# Patient Record
Sex: Female | Born: 1990 | Race: White | Hispanic: No | Marital: Single | Smoking: Former smoker
Health system: Southern US, Community
[De-identification: ages and names within clinical notes are randomized; demographics above are authoritative.]

## PROBLEM LIST (undated history)

## (undated) DIAGNOSIS — R51 Headache: Secondary | ICD-10-CM

## (undated) DIAGNOSIS — F32A Depression, unspecified: Secondary | ICD-10-CM

## (undated) DIAGNOSIS — T7840XA Allergy, unspecified, initial encounter: Secondary | ICD-10-CM

## (undated) DIAGNOSIS — F329 Major depressive disorder, single episode, unspecified: Secondary | ICD-10-CM

## (undated) DIAGNOSIS — R519 Headache, unspecified: Secondary | ICD-10-CM

## (undated) DIAGNOSIS — R011 Cardiac murmur, unspecified: Secondary | ICD-10-CM

## (undated) DIAGNOSIS — F988 Other specified behavioral and emotional disorders with onset usually occurring in childhood and adolescence: Secondary | ICD-10-CM

## (undated) HISTORY — DX: Major depressive disorder, single episode, unspecified: F32.9

## (undated) HISTORY — DX: Headache, unspecified: R51.9

## (undated) HISTORY — DX: Other specified behavioral and emotional disorders with onset usually occurring in childhood and adolescence: F98.8

## (undated) HISTORY — DX: Allergy, unspecified, initial encounter: T78.40XA

## (undated) HISTORY — DX: Depression, unspecified: F32.A

## (undated) HISTORY — DX: Cardiac murmur, unspecified: R01.1

## (undated) HISTORY — DX: Headache: R51

---
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
## 2012-09-12 LAB — HM PAP SMEAR: HM Pap smear: NORMAL

## 2013-05-17 ENCOUNTER — Encounter: Payer: Self-pay | Admitting: Family Medicine

## 2013-05-17 ENCOUNTER — Ambulatory Visit (INDEPENDENT_AMBULATORY_CARE_PROVIDER_SITE_OTHER): Payer: 59 | Admitting: Family Medicine

## 2013-05-17 VITALS — BP 104/68 | HR 75 | Temp 98.0°F | Ht 66.5 in | Wt 130.5 lb

## 2013-05-17 DIAGNOSIS — Z0000 Encounter for general adult medical examination without abnormal findings: Secondary | ICD-10-CM | POA: Insufficient documentation

## 2013-05-17 DIAGNOSIS — F988 Other specified behavioral and emotional disorders with onset usually occurring in childhood and adolescence: Secondary | ICD-10-CM | POA: Insufficient documentation

## 2013-05-17 DIAGNOSIS — Z136 Encounter for screening for cardiovascular disorders: Secondary | ICD-10-CM

## 2013-05-17 LAB — LIPID PANEL
CHOL/HDL RATIO: 2
CHOLESTEROL: 148 mg/dL (ref 0–200)
HDL: 78.2 mg/dL (ref >39.00)
LDL Cholesterol: 61 mg/dL (ref 0–99)
TRIGLYCERIDES: 43 mg/dL (ref 0.0–149.0)
VLDL: 8.6 mg/dL (ref 0.0–40.0)

## 2013-05-17 LAB — CBC WITH DIFFERENTIAL/PLATELET
Basophils Absolute: 0 10*3/uL (ref 0.0–0.1)
Basophils Relative: 0.8 % (ref 0.0–3.0)
EOS PCT: 1.6 % (ref 0.0–5.0)
Eosinophils Absolute: 0 10*3/uL (ref 0.0–0.7)
HEMATOCRIT: 36 % (ref 36.0–46.0)
HEMOGLOBIN: 12.3 g/dL (ref 12.0–15.0)
Lymphocytes Relative: 42.4 % (ref 12.0–46.0)
Lymphs Abs: 1.3 10*3/uL (ref 0.7–4.0)
MCHC: 34.3 g/dL (ref 30.0–36.0)
MCV: 92.8 fl (ref 78.0–100.0)
MONO ABS: 0.4 10*3/uL (ref 0.1–1.0)
Monocytes Relative: 15 % — ABNORMAL HIGH (ref 3.0–12.0)
NEUTROS ABS: 1.2 10*3/uL — AB (ref 1.4–7.7)
NEUTROS PCT: 40.2 % — AB (ref 43.0–77.0)
Platelets: 196 10*3/uL (ref 150.0–400.0)
RBC: 3.88 Mil/uL (ref 3.87–5.11)
RDW: 12.5 % (ref 11.5–15.5)
WBC: 3 10*3/uL — AB (ref 4.0–10.5)

## 2013-05-17 LAB — COMPREHENSIVE METABOLIC PANEL
ALBUMIN: 4.1 g/dL (ref 3.5–5.2)
ALT: 14 U/L (ref 0–35)
AST: 16 U/L (ref 0–37)
Alkaline Phosphatase: 31 U/L — ABNORMAL LOW (ref 39–117)
BILIRUBIN TOTAL: 1 mg/dL (ref 0.2–1.2)
BUN: 12 mg/dL (ref 6–23)
CALCIUM: 9.2 mg/dL (ref 8.4–10.5)
CHLORIDE: 106 meq/L (ref 96–112)
CO2: 27 meq/L (ref 19–32)
Creatinine, Ser: 0.9 mg/dL (ref 0.4–1.2)
GFR: 78.23 mL/min (ref >60.00)
GLUCOSE: 88 mg/dL (ref 70–99)
POTASSIUM: 4.9 meq/L (ref 3.5–5.1)
SODIUM: 138 meq/L (ref 135–145)
Total Protein: 6.5 g/dL (ref 6.0–8.3)

## 2013-05-17 LAB — TSH: TSH: 0.62 u[IU]/mL (ref 0.35–4.50)

## 2013-05-17 LAB — VITAMIN B12: VITAMIN B 12: 337 pg/mL (ref 211–911)

## 2013-05-17 NOTE — Assessment & Plan Note (Signed)
Awaiting records from psychiatry, then will refill her Adderall.  She is aware of UDS/contolled substances contract.

## 2013-05-17 NOTE — Progress Notes (Signed)
Pre visit review using our clinic review tool, if applicable. No additional management support is needed unless otherwise documented below in the visit note. 

## 2013-05-17 NOTE — Progress Notes (Signed)
Subjective:   Patient ID: Angie BonAmanda Scott, female    DOB: 07/04/1990, 23 y.o.   MRN: [id number]  Angie Scott is a pleasant 23 y.o. year old female who presents to clinic today with Establish Care  on 05/17/2013  HPI: ADD- formal testing in 2010- [place] in [place].  Recently moved back home- [place], boyfriend [place].  She done Social research officer, governmentinterior design.  G0- sexually active with boyfriend only.  Recently screened for STDs.  Uses condoms.  She thinks she is due for pap smear.  Denies any dysuria or vaginal discharge.  She does run and tries to eat a healthy diet.  Patient Active Problem List   Diagnosis Date Noted  . ADD (attention deficit disorder) 05/17/2013  . Routine general medical examination at a health care facility 05/17/2013   Past Medical History  Diagnosis Date  . Allergy   . Heart murmur   . Frequent headaches   . ADD (attention deficit disorder)   . Depression    Past Surgical History  Procedure Laterality Date  . Tonsillectomy and adenoidectomy  2009   History  Substance Use Topics  . Smoking status: Former Games developermoker  . Smokeless tobacco: Not on file     Comment: in college  . Alcohol Use: Yes     Comment: occasional   Family History  Problem Relation Age of Onset  . Depression Father   . Diabetes Father   . Depression Sister     Anxiety  . Heart disease Maternal Uncle   . Heart disease Maternal Grandmother   . Alcohol abuse Paternal Grandfather    Allergies  Allergen Reactions  . Lexapro [Escitalopram Oxalate]     Rash   . Nickel Swelling and Rash   No current outpatient prescriptions on file prior to visit.   No current facility-administered medications on file prior to visit.   The PMH, PSH, Social History, Family History, Medications, and allergies have been [facility], and have been updated if relevant.   Review of Systems See HPI Patient reports no  vision/ hearing changes,anorexia,  weight change, fever ,adenopathy, persistant / recurrent hoarseness, swallowing issues, chest pain, edema,persistant / recurrent cough, hemoptysis, dyspnea(rest, exertional, paroxysmal nocturnal), gastrointestinal  bleeding (melena, rectal bleeding), abdominal pain, excessive heart burn, GU symptoms(dysuria, hematuria, pyuria, voiding/incontinence  Issues) syncope, focal weakness, severe memory loss, concerning skin lesions, depression, anxiety, abnormal bruising/bleeding, major joint swelling, breast masses or abnormal vaginal bleeding.       Objective:    BP 104/68  Pulse 75  Temp(Src) 98 F (36.7 C) (Oral)  Ht 5' 6.5" (1.689 m)  Wt 130 lb 8 oz (59.194 kg)  BMI 20.75 kg/m2  SpO2 98%  LMP 04/26/2013   Physical Exam   General:  Well-developed,well-nourished,in no acute distress; alert,appropriate and cooperative throughout examination Head:  normocephalic and atraumatic.   Eyes:  vision grossly intact, pupils equal, pupils round, and pupils reactive to light.   Lungs:  Normal respiratory effort, chest expands symmetrically. Lungs are clear to auscultation, no crackles or wheezes. Heart:  Normal rate and regular rhythm. S1 and S2 normal without gallop, murmur, click, rub or other extra sounds. Abdomen:  Bowel sounds positive,abdomen soft and non-tender without masses, organomegaly or hernias noted. Msk:  No deformity or scoliosis noted of thoracic or lumbar spine.   Extremities:  No clubbing, cyanosis, edema, or deformity noted with normal full range of motion of all joints.   Neurologic:  alert &  oriented X3 and gait normal.   Skin:  Intact without suspicious lesions or rashes Cervical Nodes:  No lymphadenopathy noted Axillary Nodes:  No palpable lymphadenopathy Psych:  Cognition and judgment appear intact. Alert and cooperative with normal attention span and concentration. No apparent delusions, illusions, hallucinations     Assessment & Plan:   ADD (attention deficit  disorder)  Routine general medical examination at a health care facility - Plan: Vitamin B12, CBC with Differential, Comprehensive metabolic panel, TSH  Screening for ischemic heart disease - Plan: Lipid panel Return for a complete physical..

## 2013-05-17 NOTE — Patient Instructions (Signed)
Nice to meet you. We will call you with your lab results.   

## 2013-05-17 NOTE — Assessment & Plan Note (Signed)
Reviewed preventive care protocols, scheduled due services, and updated immunizations Discussed nutrition, exercise, diet, and healthy lifestyle.  She would like to return for pap smear at later date. Orders Placed This Encounter  Procedures  . HM PAP SMEAR  . Vitamin B12  . CBC with Differential  . Comprehensive metabolic panel  . Lipid panel  . TSH

## 2013-07-31 ENCOUNTER — Encounter: Payer: 59 | Admitting: Family Medicine

## 2013-08-01 NOTE — Progress Notes (Signed)
   Subjective:    Patient ID: Angie Scott, female    DOB: 05/29/1990, 23 y.o.   MRN: [id number]  HPI    Review of Systems     Objective:   Physical Exam        Assessment & Plan:  error

## 2013-08-07 ENCOUNTER — Encounter: Payer: 59 | Admitting: Family Medicine

## 2013-08-07 ENCOUNTER — Ambulatory Visit (INDEPENDENT_AMBULATORY_CARE_PROVIDER_SITE_OTHER): Payer: 59 | Admitting: Family Medicine

## 2013-08-07 ENCOUNTER — Other Ambulatory Visit (HOSPITAL_COMMUNITY)
Admission: RE | Admit: 2013-08-07 | Discharge: 2013-08-07 | Disposition: A | Discharge status: Home / Self Care | Payer: 59 | Source: Ambulatory Visit | Attending: Family Medicine | Admitting: Family Medicine

## 2013-08-07 ENCOUNTER — Encounter: Payer: Self-pay | Admitting: Family Medicine

## 2013-08-07 VITALS — BP 108/68 | HR 66 | Temp 98.3°F | Wt 127.5 lb

## 2013-08-07 DIAGNOSIS — Z1151 Encounter for screening for human papillomavirus (HPV): Secondary | ICD-10-CM | POA: Insufficient documentation

## 2013-08-07 DIAGNOSIS — Z01419 Encounter for gynecological examination (general) (routine) without abnormal findings: Secondary | ICD-10-CM | POA: Insufficient documentation

## 2013-08-07 DIAGNOSIS — Z0000 Encounter for general adult medical examination without abnormal findings: Secondary | ICD-10-CM

## 2013-08-07 MED ORDER — AMPHETAMINE-DEXTROAMPHETAMINE 20 MG PO TABS: 40.0000 mg | ORAL_TABLET | Freq: Every day | ORAL | Status: DC

## 2013-08-07 NOTE — Progress Notes (Signed)
Subjective:   Patient ID: Angie Scott, female    DOB: 06/16/1990, 23 y.o.   MRN: [id number]  Angie Bonmanda Melaragno is a pleasant 23 y.o. year old female who presents to clinic today with Gynecologic Exam  on 08/07/2013  HPI: G0- sexually active with boyfriend only. Recently screened for STDs. Uses condoms. She thinks she is due for pap smear.  Denies any dysuria or vaginal discharge.  Current Outpatient Prescriptions on File Prior to Visit  Medication Sig Dispense Refill  . Multiple Vitamin (MULTIVITAMIN) tablet Take 1 tablet by mouth daily.       No current facility-administered medications on file prior to visit.    Allergies  Allergen Reactions  . Lexapro [Escitalopram Oxalate]     Rash   . Nickel Swelling and Rash    Past Medical History  Diagnosis Date  . Allergy   . Heart murmur   . Frequent headaches   . ADD (attention deficit disorder)   . Depression     Past Surgical History  Procedure Laterality Date  . Tonsillectomy and adenoidectomy  2009    Family History  Problem Relation Age of Onset  . Depression Father   . Diabetes Father   . Depression Sister     Anxiety  . Heart disease Maternal Uncle   . Heart disease Maternal Grandmother   . Alcohol abuse Paternal Grandfather     History   Social History  . Marital Status: Single    Spouse Name: N/A    Number of Children: N/A  . Years of Education: N/A   Occupational History  . Not on file.   Social History Main Topics  . Smoking status: Former Games developermoker  . Smokeless tobacco: Not on file     Comment: in college  . Alcohol Use: Yes     Comment: occasional  . Drug Use: No  . Sexual Activity: Not on file   Other Topics Concern  . Not on file   Social History Narrative  . No narrative on file   The PMH, PSH, Social History, Family History, Medications, and allergies have been [facility], and have been updated if relevant.   Review of Systems   See HPI No dysuria No vaginal  discharge  Objective:    BP 108/68  Pulse 66  Temp(Src) 98.3 F (36.8 C) (Oral)  Wt 127 lb 8 oz (57.834 kg)  SpO2 98%  LMP 07/30/2013   Physical Exam   General:  Well-developed,well-nourished,in no acute distress; alert,appropriate and cooperative throughout examination Head:  normocephalic and atraumatic.   Breasts:  No mass, nodules, thickening, tenderness, bulging, retraction, inflamation, nipple discharge or skin changes noted.  . Rectal:  no external abnormalities.   Genitalia:  Pelvic Exam:        External: normal female genitalia without lesions or masses        Vagina: normal without lesions or masses        Cervix: normal without lesions or masses        Adnexa: normal bimanual exam without masses or fullness        Uterus: normal by palpation        Pap smear: performed Skin:  Intact without suspicious lesions or rashes Psych:  Cognition and judgment appear intact. Alert and cooperative with normal attention span and concentration. No apparent delusions, illusions, hallucinations     Assessment & Plan:   Encounter for routine gynecological examination - Plan: Cytology - PAP [place]  Routine  general medical examination at a health care facility - Plan: Cytology - PAP [place] No Follow-up on file.

## 2013-08-07 NOTE — Progress Notes (Signed)
Pre visit review using our clinic review tool, if applicable. No additional management support is needed unless otherwise documented below in the visit note. 

## 2013-08-07 NOTE — Assessment & Plan Note (Signed)
Pap smear done today.  Discussed dangers of smoking, alcohol, and drug abuse.  Also discussed sexual activity, pregnancy risk, and STD risk.  Encouraged to get regular exercise.  

## 2013-08-07 NOTE — Assessment & Plan Note (Deleted)
Pap smear done today

## 2013-08-09 ENCOUNTER — Encounter: Payer: Self-pay | Admitting: *Deleted

## 2013-08-09 LAB — CYTOLOGY - PAP

## 2013-08-16 ENCOUNTER — Telehealth: Payer: Self-pay

## 2013-08-16 MED ORDER — AMPHETAMINE-DEXTROAMPHETAMINE 20 MG PO TABS: 40.0000 mg | ORAL_TABLET | Freq: Every day | ORAL | Status: DC

## 2013-08-16 NOTE — Telephone Encounter (Signed)
Pt left v/m; pt took adderall 20 mg rx to pharmacy and was advised to get another adderall rx for quantity # 60(pt takes 2 tabs daily). Pt request cb when ready for pick up. Pt said pharmacy advised pt will also need prior auth for adderall. Pt request cb.

## 2013-08-17 ENCOUNTER — Telehealth: Payer: Self-pay | Admitting: Family Medicine

## 2013-08-17 ENCOUNTER — Encounter: Payer: Self-pay | Admitting: Family Medicine

## 2013-08-17 NOTE — Telephone Encounter (Signed)
Lm on pts vm advising her that she not on adderall XR based on medication list. Pt advised to contact office back should she have any additional questions

## 2013-08-17 NOTE — Telephone Encounter (Signed)
Spoke to pt who states that she will contact [place] and have them fax over confirmation that pt was taking Adderall XR instead of regular Adderall

## 2013-08-17 NOTE — Telephone Encounter (Signed)
Lm on pts vm informing her Rx is available for pickup at the front desk 

## 2013-08-17 NOTE — Telephone Encounter (Signed)
Patient returned your call.

## 2013-08-17 NOTE — Telephone Encounter (Signed)
Pt left v/m; pt picked up corrected rx for adderall. Pt said she needs to come back for another corrected prescription of adderall XR.(Do not see XR on current or historical med list.) Pt request cb.

## 2013-08-21 NOTE — Telephone Encounter (Signed)
Ok to change

## 2013-08-21 NOTE — Telephone Encounter (Signed)
Paperwork received indicating pt takes Adderall XR 20mg  1-2 tabs every am. Pt is requesting a new Rx to reflect new sig and to have #60

## 2013-08-21 NOTE — Telephone Encounter (Addendum)
Pt left v/m wanting to verify received documentation from PCP [place] and pt request cb with status of being able to pickup adderall xr rx. Pt is leaving country on 08/25/13.

## 2013-09-04 ENCOUNTER — Encounter: Payer: Self-pay | Admitting: Family Medicine

## 2014-03-22 ENCOUNTER — Ambulatory Visit (INDEPENDENT_AMBULATORY_CARE_PROVIDER_SITE_OTHER): Payer: 59

## 2014-03-22 ENCOUNTER — Ambulatory Visit (INDEPENDENT_AMBULATORY_CARE_PROVIDER_SITE_OTHER): Payer: 59 | Admitting: Podiatry

## 2014-03-22 ENCOUNTER — Encounter: Payer: Self-pay | Admitting: Podiatry

## 2014-03-22 VITALS — BP 101/60 | HR 72 | Resp 12

## 2014-03-22 DIAGNOSIS — M21611 Bunion of right foot: Secondary | ICD-10-CM

## 2014-03-22 DIAGNOSIS — M21612 Bunion of left foot: Secondary | ICD-10-CM

## 2014-03-22 DIAGNOSIS — M2012 Hallux valgus (acquired), left foot: Secondary | ICD-10-CM

## 2014-03-22 DIAGNOSIS — M2011 Hallux valgus (acquired), right foot: Secondary | ICD-10-CM

## 2014-03-22 DIAGNOSIS — M779 Enthesopathy, unspecified: Secondary | ICD-10-CM | POA: Diagnosis not present

## 2014-03-22 NOTE — Patient Instructions (Signed)
Pre-Operative Instructions  Congratulations, you have decided to take an important step to improving your quality of life.  You can be assured that the doctors of [facility] will be with you every step of the way.  1. Plan to be at the surgery center/hospital at least 1 (one) hour prior to your scheduled time unless otherwise directed by the surgical center/hospital staff.  You must have a responsible adult accompany you, remain during the surgery and drive you home.  Make sure you have directions to the surgical center/hospital and know how to get there on time. 2. For hospital based surgery you will need to obtain a history and physical form from your family physician within 1 month prior to the date of surgery- we will give you a form for you primary physician.  3. We make every effort to accommodate the date you request for surgery.  There are however, times where surgery dates or times have to be moved.  We will contact you as soon as possible if a change in schedule is required.   4. No Aspirin/Ibuprofen for one week before surgery.  If you are on aspirin, any non-steroidal anti-inflammatory medications (Mobic, Aleve, Ibuprofen) you should stop taking it 7 days prior to your surgery.  You make take Tylenol  For pain prior to surgery.  5. Medications- If you are taking daily heart and blood pressure medications, seizure, reflux, allergy, asthma, anxiety, pain or diabetes medications, make sure the surgery center/hospital is aware before the day of surgery so they may notify you which medications to take or avoid the day of surgery. 6. No food or drink after midnight the night before surgery unless directed otherwise by surgical center/hospital staff. 7. No alcoholic beverages 24 hours prior to surgery.  No smoking 24 hours prior to or 24 hours after surgery. 8. Wear loose pants or shorts- loose enough to fit over bandages, boots, and casts. 9. No slip on shoes, sneakers are best. 10. Bring  your boot with you to the surgery center/hospital.  Also bring crutches or a walker if your physician has prescribed it for you.  If you do not have this equipment, it will be provided for you after surgery. 11. If you have not been contracted by the surgery center/hospital by the day before your surgery, call to confirm the date and time of your surgery. 12. Leave-time from work may vary depending on the type of surgery you have.  Appropriate arrangements should be made prior to surgery with your employer. 13. Prescriptions will be provided immediately following surgery by your doctor.  Have these filled as soon as possible after surgery and take the medication as directed. 14. Remove nail polish on the operative foot. 15. Wash the night before surgery.  The night before surgery wash the foot and leg well with the antibacterial soap provided and water paying special attention to beneath the toenails and in between the toes.  Rinse thoroughly with water and dry well with a towel.  Perform this wash unless told not to do so by your physician.  Enclosed: 1 Ice pack (please put in freezer the night before surgery)   1 Hibiclens skin cleaner   Pre-op Instructions  If you have any questions regarding the instructions, do not hesitate to call our office.  [place]: [address] [place], [place] [address] [phone]  [place]: [address] [place], [place] [address] [phone]  [place]: [address]  [place], [place] [address] [phone]  Dr. Richard   Tuchman DPM, Dr. Norman Regal DPM Dr. Richard Sikora DPM, Dr. M. Todd Hyatt DPM, Dr. Kathryn Egerton DPM 

## 2014-03-22 NOTE — Progress Notes (Signed)
Subjective:     Patient ID: Angie BonAmanda Scott, female   DOB: 04/20/1990, 24 y.o.   MRN: [id number]  HPI patient states I have bunions on both feet with the right one hurting me more than the left one and states she's tried to modify her shoe gear has tried soaks and has significant family history of this problem with mother having the same issues   Review of Systems  All other systems reviewed and are negative.      Objective:   Physical Exam  Constitutional: She is oriented to person, place, and time.  Cardiovascular: Intact distal pulses.   Musculoskeletal: Normal range of motion.  Neurological: She is oriented to person, place, and time.  Skin: Skin is warm.  Nursing note and vitals reviewed.  neurovascular status intact with muscle strength adequate and range of motion subtalar midtarsal joint within normal limits. Patient's noted to have structural redness around the first metatarsal head right over left with deviation of the hallux against the second toe and is noted to have pain with pressure. 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 The right foot is quite symptomatic and pain when pressed and she would like to have it corrected due to long-term history failure to be able to wear adequate shoe gear and family history. I do agree with her that this is best to correct at this point due to the advancement of symptoms and pain and I allowed her to read a consent form concerning correction reviewing all possible complications and alternative treatments. Patient wants surgery signed consent form and is given all preoperative instructions at this time and is dispensed air fracture walker for the postoperative period with instructions on  how to get used to it. Also at this time she is scanned for custom orthotics to reduce stress against her arches

## 2014-03-22 NOTE — Progress Notes (Signed)
   Subjective:    Patient ID: Angie Scott, female    DOB: 11/09/1990, 24 y.o.   MRN: [id number]  HPI  PT STATED B/L BUNION ARE SORE ESPECIALLY THE RT FOOT FOR 1 YEAR. FEET ARE GETTING WORSE ESPECIALLY WHEN WEARING FLAT SHOES/BAREFOOT. TRIED WEARING GOOD SHOES/RUBBER AROUND JOINT OF THE BIG TOE BUT NO HELP.  Review of Systems  All other systems reviewed and are negative.      Objective:   Physical Exam        Assessment & Plan:

## 2014-03-26 ENCOUNTER — Ambulatory Visit: Payer: Self-pay | Admitting: Family Medicine

## 2014-03-30 ENCOUNTER — Telehealth: Payer: Self-pay | Admitting: *Deleted

## 2014-03-30 NOTE — Telephone Encounter (Addendum)
"  I want to schedule surgery with Dr. Charlsie Merlesegal."  When would you like to schedule?  "I would like to schedule for a day first week of April."  He can do it on 04/17/2014.  "That will be great.  I'd also like to speak to someone in billing to see how much thw surgery is going to cost me."  I will take care of that for you but I will have to call you back with that information after I do the calculations.  "Okay, that will be fine."

## 2014-04-17 ENCOUNTER — Encounter: Payer: Self-pay | Admitting: Podiatry

## 2014-04-17 DIAGNOSIS — M2011 Hallux valgus (acquired), right foot: Secondary | ICD-10-CM | POA: Diagnosis not present

## 2014-04-20 NOTE — Progress Notes (Signed)
DOS 04/17/2014 Austin bunionectomy (cutting and moving bone) with pin fixation right foot.

## 2014-04-23 ENCOUNTER — Ambulatory Visit (INDEPENDENT_AMBULATORY_CARE_PROVIDER_SITE_OTHER): Payer: 59

## 2014-04-23 ENCOUNTER — Ambulatory Visit (INDEPENDENT_AMBULATORY_CARE_PROVIDER_SITE_OTHER): Payer: 59 | Admitting: Podiatry

## 2014-04-23 DIAGNOSIS — Z9889 Other specified postprocedural states: Secondary | ICD-10-CM

## 2014-04-23 DIAGNOSIS — M2011 Hallux valgus (acquired), right foot: Secondary | ICD-10-CM

## 2014-04-23 DIAGNOSIS — M21611 Bunion of right foot: Secondary | ICD-10-CM

## 2014-04-23 DIAGNOSIS — M2012 Hallux valgus (acquired), left foot: Secondary | ICD-10-CM

## 2014-04-23 DIAGNOSIS — M21612 Bunion of left foot: Secondary | ICD-10-CM

## 2014-04-24 NOTE — Progress Notes (Signed)
Subjective:     Patient ID: Gae BonAmanda Laplant, female   DOB: 03/01/1990, 24 y.o.   MRN: [id number]  HPI patient presents stating my right foot is doing really well and I want to consider getting my left bunion fixed   Review of Systems     Objective:   Physical Exam Neurovascular status intact muscle strength adequate with well-healed surgical site right first metatarsal with hallux in rectus position wound edges well coapted and good motion of the joint with 30 of dorsiflexion 20 plantar flexion. Structural bunion noted left    Assessment:     Doing well post osteotomy first metatarsal right with structural bunion deformity noted left    Plan:     H&P and x-rays reviewed of right foot. Patient will continue with immobilization compression and made range of motion exercises and will be seen back again in 3 weeks. Discussed correction of the left which she can get done in the next several weeks and she will look at her schedule and decide if she wants to go this direction and if she does we will have to do consent form prior to procedure

## 2014-05-14 ENCOUNTER — Ambulatory Visit (INDEPENDENT_AMBULATORY_CARE_PROVIDER_SITE_OTHER): Payer: 59

## 2014-05-14 ENCOUNTER — Encounter: Payer: Self-pay | Admitting: Podiatry

## 2014-05-14 ENCOUNTER — Ambulatory Visit (INDEPENDENT_AMBULATORY_CARE_PROVIDER_SITE_OTHER): Payer: 59 | Admitting: Podiatry

## 2014-05-14 DIAGNOSIS — Z9889 Other specified postprocedural states: Secondary | ICD-10-CM

## 2014-05-14 DIAGNOSIS — M21611 Bunion of right foot: Secondary | ICD-10-CM

## 2014-05-14 DIAGNOSIS — M2011 Hallux valgus (acquired), right foot: Secondary | ICD-10-CM

## 2014-05-14 NOTE — Progress Notes (Signed)
Subjective:     Patient ID: Angie Scott, female   DOB: 07/03/1990, 24 y.o.   MRN: [id number]  HPI patient presents stating I'm doing very well but I been on hold off on getting my other foot fixed as on antibiotic   Review of Systems     Objective:   Physical Exam Neurovascular status intact muscle strength adequate with range of motion of the subtalar midtarsal joint within normal limits. Patient's noted to have a well structured first metatarsal right with good alignment of the hallux and no indications of movement    Assessment:     Doing well post Austin osteotomy right foot    Plan:     H&P and x-rays reviewed with patient and today I'm allowing her to gradual return soft shoe and I dispensed and anklet. Reappoint to recheck in 4 weeks

## 2014-05-24 ENCOUNTER — Encounter: Payer: Self-pay | Admitting: Family Medicine

## 2014-05-30 ENCOUNTER — Other Ambulatory Visit (HOSPITAL_COMMUNITY)
Admission: RE | Admit: 2014-05-30 | Discharge: 2014-05-30 | Disposition: A | Discharge status: Home / Self Care | Payer: 59 | Source: Ambulatory Visit | Attending: Family Medicine | Admitting: Family Medicine

## 2014-05-30 ENCOUNTER — Encounter: Payer: 59 | Admitting: Podiatry

## 2014-05-30 ENCOUNTER — Encounter: Payer: Self-pay | Admitting: Family Medicine

## 2014-05-30 ENCOUNTER — Ambulatory Visit (INDEPENDENT_AMBULATORY_CARE_PROVIDER_SITE_OTHER): Payer: 59 | Admitting: Family Medicine

## 2014-05-30 VITALS — BP 106/58 | HR 73 | Temp 97.6°F | Ht 66.75 in | Wt 131.5 lb

## 2014-05-30 DIAGNOSIS — Z30011 Encounter for initial prescription of contraceptive pills: Secondary | ICD-10-CM

## 2014-05-30 DIAGNOSIS — Z113 Encounter for screening for infections with a predominantly sexual mode of transmission: Secondary | ICD-10-CM

## 2014-05-30 DIAGNOSIS — F909 Attention-deficit hyperactivity disorder, unspecified type: Secondary | ICD-10-CM | POA: Diagnosis not present

## 2014-05-30 DIAGNOSIS — F988 Other specified behavioral and emotional disorders with onset usually occurring in childhood and adolescence: Secondary | ICD-10-CM

## 2014-05-30 DIAGNOSIS — Z1151 Encounter for screening for human papillomavirus (HPV): Secondary | ICD-10-CM | POA: Insufficient documentation

## 2014-05-30 DIAGNOSIS — Z01419 Encounter for gynecological examination (general) (routine) without abnormal findings: Secondary | ICD-10-CM

## 2014-05-30 DIAGNOSIS — Z0000 Encounter for general adult medical examination without abnormal findings: Secondary | ICD-10-CM

## 2014-05-30 DIAGNOSIS — N760 Acute vaginitis: Secondary | ICD-10-CM | POA: Diagnosis present

## 2014-05-30 DIAGNOSIS — Z309 Encounter for contraceptive management, unspecified: Secondary | ICD-10-CM | POA: Insufficient documentation

## 2014-05-30 LAB — COMPREHENSIVE METABOLIC PANEL
ALBUMIN: 4.3 g/dL (ref 3.5–5.2)
ALK PHOS: 37 U/L — AB (ref 39–117)
ALT: 16 U/L (ref 0–35)
AST: 18 U/L (ref 0–37)
BUN: 11 mg/dL (ref 6–23)
CALCIUM: 9.3 mg/dL (ref 8.4–10.5)
CHLORIDE: 104 meq/L (ref 96–112)
CO2: 30 meq/L (ref 19–32)
Creatinine, Ser: 0.8 mg/dL (ref 0.40–1.20)
GFR: 92.3 mL/min (ref >60.00)
GLUCOSE: 98 mg/dL (ref 70–99)
POTASSIUM: 4.1 meq/L (ref 3.5–5.1)
Sodium: 139 mEq/L (ref 135–145)
Total Bilirubin: 1 mg/dL (ref 0.2–1.2)
Total Protein: 6.8 g/dL (ref 6.0–8.3)

## 2014-05-30 LAB — TSH: TSH: 1.38 u[IU]/mL (ref 0.35–4.50)

## 2014-05-30 LAB — CBC WITH DIFFERENTIAL/PLATELET
BASOS ABS: 0 10*3/uL (ref 0.0–0.1)
Basophils Relative: 1.2 % (ref 0.0–3.0)
EOS ABS: 0.1 10*3/uL (ref 0.0–0.7)
Eosinophils Relative: 4.5 % (ref 0.0–5.0)
HCT: 37.6 % (ref 36.0–46.0)
HEMOGLOBIN: 12.9 g/dL (ref 12.0–15.0)
LYMPHS PCT: 39.4 % (ref 12.0–46.0)
Lymphs Abs: 1.2 10*3/uL (ref 0.7–4.0)
MCHC: 34.2 g/dL (ref 30.0–36.0)
MCV: 92.3 fl (ref 78.0–100.0)
MONOS PCT: 10.7 % (ref 3.0–12.0)
Monocytes Absolute: 0.3 10*3/uL (ref 0.1–1.0)
NEUTROS ABS: 1.3 10*3/uL — AB (ref 1.4–7.7)
NEUTROS PCT: 44.2 % (ref 43.0–77.0)
Platelets: 239 10*3/uL (ref 150.0–400.0)
RBC: 4.07 Mil/uL (ref 3.87–5.11)
RDW: 12.7 % (ref 11.5–15.5)
WBC: 3 10*3/uL — ABNORMAL LOW (ref 4.0–10.5)

## 2014-05-30 LAB — LIPID PANEL
Cholesterol: 165 mg/dL (ref 0–200)
HDL: 73.5 mg/dL (ref >39.00)
LDL Cholesterol: 78 mg/dL (ref 0–99)
NONHDL: 91.5
TRIGLYCERIDES: 67 mg/dL (ref 0.0–149.0)
Total CHOL/HDL Ratio: 2
VLDL: 13.4 mg/dL (ref 0.0–40.0)

## 2014-05-30 MED ORDER — NORETHIN-ETH ESTRAD-FE BIPHAS 1 MG-10 MCG / 10 MCG PO TABS: 1.0000 | ORAL_TABLET | Freq: Every day | ORAL | Status: DC

## 2014-05-30 NOTE — Progress Notes (Signed)
Pre visit review using our clinic review tool, if applicable. No additional management support is needed unless otherwise documented below in the visit note. 

## 2014-05-30 NOTE — Assessment & Plan Note (Signed)
Reviewed preventive care protocols, scheduled due services, and updated immunizations Discussed nutrition, exercise, diet, and healthy lifestyle.  Orders Placed This Encounter  Procedures  . CBC with Differential/Platelet  . Comprehensive metabolic panel  . Lipid panel  . TSH  . HIV antibody (with reflex)  . RPR  . HSV(herpes simplex vrs) 1+2 ab-IgM

## 2014-05-30 NOTE — Assessment & Plan Note (Signed)
Education given regarding options for contraception, including barrier methods, injectable contraception, IUD placement, oral contraceptives.  She would like to try OCPs.  Years ago tried Lo loestrin and it worked well for her. eRx sent for [employer]oloestrin.  The patient indicates understanding of these issues and agrees with the plan.

## 2014-05-30 NOTE — Assessment & Plan Note (Signed)
Discussed USPSTF recommendations of cervical cancer screening.  She is aware that interval of 2 years is recommended but pt would prefer to have pap smear done today.

## 2014-05-30 NOTE — Progress Notes (Signed)
Subjective:   Patient ID: Angie Scott, female    DOB: 12/12/1990, 24 y.o.   MRN: [id number]  Angie Bonmanda Tuazon is a pleasant 24 y.o. year old female who presents to clinic today with Annual Exam and Contraception  on 05/30/2014  HPI:  Recently moved to [place]!  Has been living 3 months here and 3 months in [place].  Leaving again for [place] next week.  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 G0- sexually active with boyfriend only.   Uses condoms.  Denies any dysuria or vaginal discharge. Last pap smear was 08/07/13 (done by me).  No h/o abnormal pap smears.  She does run and tries to eat a healthy diet.  Patient Active Problem List   Diagnosis Date Noted  . Encounter for routine gynecological examination 08/07/2013  . ADD (attention deficit disorder) 05/17/2013  . Routine general medical examination at a health care facility 05/17/2013   Past Medical History  Diagnosis Date  . Allergy   . Heart murmur   . Frequent headaches   . ADD (attention deficit disorder)   . Depression    Past Surgical History  Procedure Laterality Date  . Tonsillectomy and adenoidectomy  2009   History  Substance Use Topics  . Smoking status: Former Games developermoker  . Smokeless tobacco: Not on file     Comment: in college  . Alcohol Use: Yes     Comment: occasional   Family History  Problem Relation Age of Onset  . Depression Father   . Diabetes Father   . Depression Sister     Anxiety  . Heart disease Maternal Uncle   . Heart disease Maternal Grandmother   . Alcohol abuse Paternal Grandfather    Allergies  Allergen Reactions  . Lexapro [Escitalopram Oxalate]     Rash   . Nickel Swelling and Rash   Current Outpatient Prescriptions on File Prior to Visit  Medication Sig Dispense Refill  . Multiple Vitamin (MULTIVITAMIN) tablet Take 1 tablet by mouth daily.     No current facility-administered medications on file prior to visit.   The PMH, PSH, Social History, Family History,  Medications, and allergies [facility], and have been updated if relevant.   Review of Systems See HPI Patient reports no  vision/ hearing changes,anorexia, weight change, fever ,adenopathy, persistant / recurrent hoarseness, swallowing issues, chest pain, edema,persistant / recurrent cough, hemoptysis, dyspnea(rest, exertional, paroxysmal nocturnal), gastrointestinal  bleeding (melena, rectal bleeding), abdominal pain, excessive heart burn, GU symptoms(dysuria, hematuria, pyuria, voiding/incontinence  Issues) syncope, focal weakness, severe memory loss, concerning skin lesions, depression, anxiety, abnormal bruising/bleeding, major joint swelling, breast masses or abnormal vaginal bleeding.       Objective:    BP 106/58 mmHg  Pulse 73  Temp(Src) 97.6 F (36.4 C) (Oral)  Ht 5' 6.75" (1.695 m)  Wt 131 lb 8 oz (59.648 kg)  BMI 20.76 kg/m2  SpO2 98%  LMP 05/21/2014   Physical Exam   General:  Well-developed,well-nourished,in no acute distress; alert,appropriate and cooperative throughout examination Head:  normocephalic and atraumatic.   Eyes:  vision grossly intact, pupils equal, pupils round, and pupils reactive to light.   Ears:  R ear normal and L ear normal.   Nose:  no external deformity.   Mouth:  good dentition.   Neck:  No deformities, masses, or tenderness noted. Breasts:  No mass, nodules, thickening, tenderness, bulging, retraction, inflamation, nipple discharge or skin changes noted.   Lungs:  Normal respiratory effort,  chest expands symmetrically. Lungs are clear to auscultation, no crackles or wheezes. Heart:  Normal rate and regular rhythm. S1 and S2 normal without gallop, murmur, click, rub or other extra sounds. Abdomen:  Bowel sounds positive,abdomen soft and non-tender without masses, organomegaly or hernias noted. Rectal:  no external abnormalities.   Genitalia:  Pelvic Exam:        External: normal female genitalia without lesions or masses         Vagina: normal without lesions or masses        Cervix: normal without lesions or masses        Adnexa: normal bimanual exam without masses or fullness        Uterus: normal by palpation        Pap smear: performed Msk:  No deformity or scoliosis noted of thoracic or lumbar spine.   Extremities:  No clubbing, cyanosis, edema, or deformity noted with normal full range of motion of all joints.   Neurologic:  alert & oriented X3 and gait normal.   Skin:  Intact without suspicious lesions or rashes Cervical Nodes:  No lymphadenopathy noted Axillary Nodes:  No palpable lymphadenopathy Psych:  Cognition and judgment appear intact. Alert and cooperative with normal attention span and concentration. No apparent delusions, illusions, hallucinations      Assessment & Plan:   Encounter for routine gynecological examination  Routine general medical examination at a health care facility  ADD (attention deficit disorder) No Follow-up on file.

## 2014-05-30 NOTE — Addendum Note (Signed)
Addended by: Desmond DikeKNIGHT, Tosh Glaze H on: 05/30/2014 09:43 AM   Modules accepted: Orders

## 2014-05-30 NOTE — Patient Instructions (Signed)
Congratulations!  We will call you with your lab results and you can view them online.

## 2014-05-30 NOTE — Addendum Note (Signed)
Addended by: Dianne DunARON, TALIA M on: 05/30/2014 09:49 AM   Modules accepted: Orders, Level of Service, SmartSet

## 2014-05-31 ENCOUNTER — Encounter: Payer: Self-pay | Admitting: *Deleted

## 2014-05-31 LAB — HSV(HERPES SIMPLEX VRS) I + II AB-IGM: HERPES SIMPLEX VRS I-IGM AB (EIA): 0.24 {index}

## 2014-05-31 LAB — RPR

## 2014-05-31 LAB — HIV ANTIBODY (ROUTINE TESTING W REFLEX): HIV 1&2 Ab, 4th Generation: NONREACTIVE

## 2014-06-01 LAB — CERVICOVAGINAL ANCILLARY ONLY: CANDIDA VAGINITIS: NEGATIVE

## 2014-06-01 LAB — CYTOLOGY - PAP

## 2014-06-05 LAB — CERVICOVAGINAL ANCILLARY ONLY: Herpes: NEGATIVE

## 2014-06-12 NOTE — Progress Notes (Signed)
This encounter was created in error - please disregard.

## 2014-06-13 ENCOUNTER — Telehealth: Payer: Self-pay | Admitting: *Deleted

## 2014-06-13 NOTE — Telephone Encounter (Signed)
Pt's mtr, Angie PikesSusan states pt is in [place] and has some questions about her surgery and would like an E-Mail address for communication.

## 2014-09-27 ENCOUNTER — Ambulatory Visit: Payer: Self-pay | Admitting: Family Medicine

## 2014-10-18 ENCOUNTER — Ambulatory Visit: Payer: 59 | Admitting: *Deleted

## 2014-10-18 DIAGNOSIS — M779 Enthesopathy, unspecified: Secondary | ICD-10-CM

## 2014-10-18 NOTE — Progress Notes (Signed)
Patient ID: Angie Scott, female   DOB: 23-Nov-1990, 24 y.o.   MRN: [id number] Patient presents for orthotic pick up.  Verbal and written break in and wear instructions given.  Patient will follow up in 4 weeks if symptoms worsen or fail to improve.

## 2014-10-18 NOTE — Patient Instructions (Signed)

## 2014-12-28 ENCOUNTER — Telehealth: Payer: Self-pay | Admitting: Family Medicine

## 2014-12-28 NOTE — Telephone Encounter (Signed)
[facility] Day - Client TELEPHONE ADVICE RECORD   [facility]    --------------------------------------------------------------------------------   Patient Name: Angie Scott  Gender: Female  DOB: 1990-02-14   Age: 24 Y 4 M 14 D  Return Phone Number: [phone] (Primary)  Address:     City/State/Zip:  [place]     Client [facility] - Client  Client Site [facility] - Day  Physician Ruthe Mannan   Contact Type Call  Call Type Triage / Clinical  Caller Name Sarika  Relationship To Patient Self  Return Phone Number [phone] (Primary)  Chief Complaint Sore Throat  Initial Comment Caller states she has a fever, scratchy throat.   PreDisposition Did not know what to do       Nurse Assessment  Nurse: Logan Bores, RN, Melissa Date/Time (Eastern Time): 12/28/2014 3:38:03 PM  Confirm and document reason for call. If symptomatic, describe symptoms. ---Caller states she has a fever, scratchy throat.    Has the patient traveled out of the country within the last 30 days? ---Not Applicable    Does the patient have any new or worsening symptoms? ---Yes    Will a triage be completed? ---Yes    Related visit to physician within the last 2 weeks? ---No    Does the PT have any chronic conditions? (i.e. diabetes, asthma, etc.) ---No    Did the patient indicate they were pregnant? ---No    Is this a behavioral health or substance abuse call? ---No           Guidelines          Guideline Title Affirmed Question Affirmed Notes Nurse Date/Time Lamount Cohen Time)  Sore Throat SEVERE (e.g., excruciating) throat pain    Evans, RN, Melissa 12/28/2014 3:38:59 PM    Disp. Time Lamount Cohen Time) Disposition Final User    12/28/2014 3:18:47 PM Attempt made - message left   Logan Bores, RN, Efraim Kaufmann      12/28/2014 3:42:14 PM See Physician within 24 Hours Yes Logan Bores, RN, Efraim Kaufmann            Caller Understands: Yes   Disagree/Comply: Comply       Care Advice Given Per Guideline        SEE PHYSICIAN WITHIN 24 HOURS: SORE THROAT - For relief of sore throat: * Sip warm chicken broth or apple juice. * Suck on hard candy or a throat lozenge (OTC). * Gargle with warm salt water four times a day. To make salt water, put 1/2 teaspoon of salt in 8 oz (240 ml) of warm water. * Avoid cigarette smoke. ACETAMINOPHEN (E.G., TYLENOL): IBUPROFEN (E.G., MOTRIN, ADVIL): NAPROXEN (E.G., ALEVE): SOFT DIET: * Eat a soft diet. * Cold drinks, popsicles, and milk shakes are especially good. Avoid citrus fruits. DRINK PLENTY LIQUIDS: * Drink plenty of liquids. This is important to prevent dehydation. * A healthy adult should drink 8 cups (240 ml) or more of liquid each day. * How can you tell if you are drinking enough liquids? The goal is to keep the urine clear or light-yellow in color. If your urine is bright yellow or dark yellow, you are probably not drinking enough liquids. CALL BACK IF: * You become worse. * IF OFFICE WILL BE OPEN: You need to be seen within the next 24 hours. Call your doctor when the office opens, and make an appointment.    After Care Instructions Given  Call Event Type User Date / Time Description        --------------------------------------------------------------------------------         Comments  User: Ardeen GarlandMelissa, Evans, RN Date/Time Lamount Cohen(Eastern Time): 12/28/2014 3:44:25 PM  Caller reports she prefers to call the office for appt when she returns back to town on Wed., she was urged to go to Urgent care where she is going, leaves early AM tomorrow. Caller preferred to wait and see how she feels when she gets back on Wed.    Referrals  REFERRED TO PCP OFFICE

## 2014-12-28 NOTE — Telephone Encounter (Signed)
Spoke with pt and she is leaving very early on 12/29/14 and will be out of town for the week of Christmas; [facility].offered Sat Clinic but pt will be gone before Sat clinic opens.

## 2014-12-31 NOTE — Telephone Encounter (Signed)
Please call to check on pt. 

## 2014-12-31 NOTE — Telephone Encounter (Signed)
Lm on pts vm requesting a call back with update 

## 2015-02-25 ENCOUNTER — Telehealth: Payer: Self-pay | Admitting: Family Medicine

## 2015-02-25 NOTE — Telephone Encounter (Signed)
PLEASE NOTE: All timestamps contained within this report are represented as [place] Standard Time. CONFIDENTIALTY NOTICE: This fax transmission is intended only for the addressee. It contains information that is legally privileged, confidential or otherwise protected from use or disclosure. If you are not the intended recipient, you are strictly prohibited from reviewing, disclosing, copying using or disseminating any of this information or taking any action in reliance on or regarding this information. If you have received this fax in error, please notify us immediately by telephone so that we can arrange for its return to [place]. Phone: [phone], Toll-Free: [phone], Fax: [phone] Page: 1 of 2 Call Id: [id number] [facility] Day - Client TELEPHONE ADVICE RECORD [facility] Patient Name: Angie Scott Gender: Female DOB: 1990-04-12 Age: 64 Y 6 M 11 D Return Phone Number: [phone] (Primary) Address: City/State/Zip: [place] Client [facility] Day - Client Client Site [facility] [place] - Day Physician Ruthe Mannan Contact Type Call Who Is Calling Patient / Member / Family / Caregiver Call Type Triage / Clinical Relationship To Patient Self Return Phone Number [phone] (Primary) Chief Complaint Coughing Up Blood Reason for Call Symptomatic / Request for Health Information Initial Comment Caller states she has had a cough in the mornings and coughed up blood x2 days. Appointment Disposition EMR Appointment Not Necessary Info pasted into Epic Yes PreDisposition Go to Urgent Care/Walk-In Clinic Translation No Nurse Assessment Nurse: Lane Hacker, RN, Elvin So Date/Time (Eastern Time): 02/25/2015 8:40:24 AM Confirm and document reason for call. If symptomatic, describe symptoms. You must click the next button to save text entered. ---Caller states she has had a cough in the mornings, and coughed up blood  noticed past 2 days. Having sinus drainage. It is light red mixed in the phlegm twice in the morning each morning. She is not coughing throughout the day. No nose bleeds. No fever. -- S/ S started 2 days after returning from a trip/flying on airplane. She had been in [place]. Has the patient traveled out of the country within the last 30 days? ---No Does the patient have any new or worsening symptoms? ---Yes Will a triage be completed? ---Yes Related visit to physician within the last 2 weeks? ---No Does the PT have any chronic conditions? (i.e. diabetes, asthma, etc.) ---No Is the patient pregnant or possibly pregnant? (Ask all females between the ages of 45-55) ---No Is this a behavioral health or substance abuse call? ---No Guidelines Guideline Title Affirmed Question Affirmed Notes Nurse Date/Time (Eastern Time) Coughing Up Blood Coughing up blood (all other triage questions negative) Lane Hacker, RN, Elvin So 02/25/2015 8:43:10 AM PLEASE NOTE: All timestamps contained within this report are represented as [place] Standard Time. CONFIDENTIALTY NOTICE: This fax transmission is intended only for the addressee. It contains information that is legally privileged, confidential or otherwise protected from use or disclosure. If you are not the intended recipient, you are strictly prohibited from reviewing, disclosing, copying using or disseminating any of this information or taking any action in reliance on or regarding this information. If you have received this fax in error, please notify us immediately by telephone so that we can arrange for its return to [place]. Phone: [phone], Toll-Free: [phone], Fax: [phone] Page: 2 of 2 Call Id: [id number] Disp. Time Lamount Cohen Time) Disposition Final User 02/25/2015 8:48:03 AM [facility], RN, Elvin So 02/25/2015 8:48:05 AM Home Care Yes Lane Hacker, RN, Elvin So Caller Understands: Yes Disagree/Comply: Comply Care Advice Given Per Guideline HOME  CARE: You  should be able to treat this at home. REASSURANCE: It doesn't sound like a serious cough. Coughing up blood can happen after a forceful coughing spell. And, it is also not uncommon during a respiratory infection to have some streaks of blood mixed in with the phlegm. In both cases, the bleeding occurs because the airways are irritated. COUGHING SPASMS: Drink warm fluids. Inhale warm mist. (Reason: both relax the airway and loosen up the phlegm) Suck on cough drops or hard candy to coat the irritated throat. HUMIDIFIER: If the air is dry, use a humidifier in the bedroom. (Reason: dry air makes coughs worse) EXPECTED COURSE: * A single episode of coughing up blood is usually not serious. * However, if the bleeding continues or recurs you will need to be examined by a doctor. CALL BACK IF: * You have difficulty breathing * Blood in sputum continues or occurs again * You cough up more than a tablespoon of pure red blood * You have a cough that lasts over 3 weeks * You become worse. CARE ADVICE given per Coughing Up Blood guideline. Comments User: Rubie Maid, RN Date/Time Lamount Cohen Time): 02/25/2015 8:45:26 AM clear phlegm

## 2015-02-25 NOTE — Telephone Encounter (Signed)
[facility] Day - Client TELEPHONE ADVICE RECORD [facility] Patient Name: Angie Scott DOB: [birth year] Initial Comment Caller states she has had a cough in the mornings and coughed up blood x2 days. Nurse Assessment Nurse: Lane Hacker, RN, Elvin So Date/Time (Eastern Time): 02/25/2015 8:40:24 AM Confirm and document reason for call. If symptomatic, describe symptoms. You must click the next button to save text entered. ---Caller states she has had a cough in the mornings, and coughed up blood noticed past 2 days. Having sinus drainage. It is light red mixed in the phlegm twice in the morning each morning. She is not coughing throughout the day. No nose bleeds. No fever. --  S/S started 2 days after returning from a trip/flying on airplane. She had been in [place]. Has the patient traveled out of the country within the last 30 days? ---No Does the patient have any new or worsening symptoms? ---Yes Will a triage be completed? ---Yes Related visit to physician within the last 2 weeks? ---No Does the PT have any chronic conditions? (i.e. diabetes, asthma, etc.) ---No Is the patient pregnant or possibly pregnant? (Ask all females between the ages of 80-55) ---No Is this a behavioral health or substance abuse call? ---No Guidelines Guideline Title Affirmed Question Affirmed Notes Coughing Up Blood Coughing up blood (all other triage questions negative) Final Disposition User [facility] [place], RN, [place] Comments clear phlegm with a little blood mixed in. Disagree/Comply: Comply

## 2015-06-05 ENCOUNTER — Other Ambulatory Visit: Payer: Self-pay | Admitting: Family Medicine

## 2015-06-24 ENCOUNTER — Ambulatory Visit: Payer: Self-pay | Admitting: Internal Medicine

## 2015-06-24 DIAGNOSIS — Z0289 Encounter for other administrative examinations: Secondary | ICD-10-CM

## 2015-07-03 ENCOUNTER — Other Ambulatory Visit: Payer: Self-pay | Admitting: Family Medicine

## 2018-05-24 ENCOUNTER — Telehealth: Payer: Self-pay | Admitting: Family Medicine

## 2018-05-24 NOTE — Telephone Encounter (Signed)
Called to see if pt is still seeing Dr Dayton Martes as PCP and if not we need to update, If she is we need to try and schedule appt with Dr Dayton Martes since it has been so long. Left message

## 2019-01-02 ENCOUNTER — Ambulatory Visit: Payer: BC Managed Care – PPO | Attending: Internal Medicine

## 2019-01-02 DIAGNOSIS — Z20822 Contact with and (suspected) exposure to covid-19: Secondary | ICD-10-CM

## 2019-01-03 LAB — NOVEL CORONAVIRUS, NAA: SARS-CoV-2, NAA: NOT DETECTED

## 2019-01-04 ENCOUNTER — Ambulatory Visit: Payer: BC Managed Care – PPO | Attending: Internal Medicine

## 2019-01-04 DIAGNOSIS — Z20822 Contact with and (suspected) exposure to covid-19: Secondary | ICD-10-CM

## 2019-01-06 LAB — NOVEL CORONAVIRUS, NAA: SARS-CoV-2, NAA: NOT DETECTED

## 2019-01-16 ENCOUNTER — Ambulatory Visit: Payer: BC Managed Care – PPO | Attending: Internal Medicine

## 2019-01-16 DIAGNOSIS — Z20822 Contact with and (suspected) exposure to covid-19: Secondary | ICD-10-CM

## 2019-01-17 LAB — NOVEL CORONAVIRUS, NAA: SARS-CoV-2, NAA: NOT DETECTED

## 2019-01-23 ENCOUNTER — Ambulatory Visit: Payer: BC Managed Care – PPO

## 2019-01-30 ENCOUNTER — Other Ambulatory Visit: Payer: BC Managed Care – PPO

## 2019-01-30 ENCOUNTER — Ambulatory Visit: Payer: BC Managed Care – PPO | Attending: Internal Medicine

## 2019-01-30 DIAGNOSIS — Z20822 Contact with and (suspected) exposure to covid-19: Secondary | ICD-10-CM

## 2019-01-31 LAB — NOVEL CORONAVIRUS, NAA: SARS-CoV-2, NAA: NOT DETECTED

## 2019-02-02 ENCOUNTER — Other Ambulatory Visit: Payer: BC Managed Care – PPO

## 2019-02-03 ENCOUNTER — Ambulatory Visit: Payer: BLUE CROSS/BLUE SHIELD | Attending: Internal Medicine

## 2019-02-03 DIAGNOSIS — Z20822 Contact with and (suspected) exposure to covid-19: Secondary | ICD-10-CM | POA: Insufficient documentation

## 2019-02-04 LAB — NOVEL CORONAVIRUS, NAA: SARS-CoV-2, NAA: NOT DETECTED
